# Patient Record
Sex: Female | Born: 1990 | Race: White | Hispanic: No | Marital: Single | Smoking: Never smoker
Health system: Southern US, Community
[De-identification: ages and names within clinical notes are randomized; demographics above are authoritative.]

---
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
## 2010-11-19 HISTORY — PX: TONSILLECTOMY: SUR1361

## 2018-10-14 ENCOUNTER — Emergency Department (HOSPITAL_COMMUNITY): Payer: BLUE CROSS/BLUE SHIELD

## 2018-10-14 ENCOUNTER — Emergency Department (HOSPITAL_COMMUNITY)
Admission: EM | Admit: 2018-10-14 | Discharge: 2018-10-15 | Disposition: A | Discharge status: Home / Self Care | Payer: BLUE CROSS/BLUE SHIELD | Attending: Emergency Medicine | Admitting: Emergency Medicine

## 2018-10-14 ENCOUNTER — Encounter (HOSPITAL_COMMUNITY): Payer: Self-pay | Admitting: Emergency Medicine

## 2018-10-14 DIAGNOSIS — H4710 Unspecified papilledema: Secondary | ICD-10-CM | POA: Insufficient documentation

## 2018-10-14 DIAGNOSIS — H539 Unspecified visual disturbance: Secondary | ICD-10-CM | POA: Diagnosis present

## 2018-10-14 NOTE — ED Provider Notes (Signed)
[facility] EMERGENCY DEPARTMENT Provider Note   CSN: [id number] Arrival date & time: 10/14/18  1841     History   Chief Complaint Chief Complaint  Patient presents with  . visual issues    HPI Katherine Stone is a 27 y.o. female presents the emergency department sent in by her optometrist for evaluation of vision loss.  The patient has had intermittent episodes of bilateral vision loss since May.  She states that she thought this was occurring because she has been spending a lot of time in front of a computer working on her masters degree.  She denies headaches, unilateral vision loss, diplopia, visual field deficits, difficulty with speech or swallowing,, nausea or vomiting.  Patient saw her PCP who referred her to an eye doctor.  He found papilledema.  He sent her with pictures.  Patient was sent for MRI evaluation.  HPI  History reviewed. No pertinent past medical history.  There are no active problems to display for this patient.    The histories are not reviewed yet. Please review them in the "History" navigator section and refresh this SmartLink.   OB History   None      Home Medications    Prior to Admission medications   Not on File    Family History No family history on file.  Social History Social History   Tobacco Use  . Smoking status: Never Smoker  . Smokeless tobacco: Never Used  Substance Use Topics  . Alcohol use: Yes    Comment: socially  . Drug use: Never     Allergies   Patient has no allergy information on record.   Review of Systems Review of Systems Ten systems reviewed and are negative for acute change, except as noted in the HPI.    Physical Exam Updated Vital Signs BP 122/82   Pulse 81   Temp 99.4 F (37.4 C) (Oral)   Resp 16   Ht 5\' 7"  (1.702 m)   Wt 64.9 kg   SpO2 100%   BMI 22.40 kg/m   Physical Exam  Constitutional: She is oriented to person, place, and time. She appears well-developed and  well-nourished. No distress.  HENT:  Head: Normocephalic and atraumatic.  Eyes: Pupils are equal, round, and reactive to light. Conjunctivae are normal. No scleral icterus.  Patient had a dilated eye exam earlier.  She has mydriatic pupils.  Neck: Normal range of motion.  Cardiovascular: Normal rate, regular rhythm and normal heart sounds. Exam reveals no gallop and no friction rub.  No murmur heard. Pulmonary/Chest: Effort normal and breath sounds normal. No respiratory distress.  Abdominal: Soft. Bowel sounds are normal. She exhibits no distension and no mass. There is no tenderness. There is no guarding.  Neurological: She is alert and oriented to person, place, and time.  Skin: Skin is warm and dry. She is not diaphoretic.  Psychiatric: Her behavior is normal.  Nursing note and vitals reviewed.    ED Treatments / Results  Labs (all labs ordered are listed, but only abnormal results are displayed) Labs Reviewed - No data to display  EKG None  Radiology No results found.  Procedures Procedures (including critical care time)  Medications Ordered in ED Medications - No data to display   Initial Impression / Assessment and Plan / ED Course  I have reviewed the triage vital signs and the nursing notes.  Pertinent labs & imaging results that were available during my care of the patient were reviewed  by me and considered in my medical decision making (see chart for details).     Since sent in for evaluation of papilledema and intermittent total vision loss.  I discussed the case with Dr. Laurence SlateAroor.  She will need MRI with and without contrast.  If negative does recommend a lumbar puncture.  Doubt venous sinus thrombosis as the patient does not have any headaches.  I have therefore opted not to perform MRV at this time.  11:45 PM I have given sign out to [facility] who will assume care of the patient .  Final Clinical Impressions(s) / ED Diagnoses   Final diagnoses:  None     ED Discharge Orders    None       Arthor CaptainHarris, Markita Stcharles, PA-C 10/14/18 2346    Cathren LaineSteinl, Kevin, MD 10/15/18 1535

## 2018-10-14 NOTE — ED Triage Notes (Signed)
Reports being seen at eye doctor today and was told she had papil edema and should have an MRI.  Symptoms blurred vision and intermittent total loss of vision.

## 2018-10-14 NOTE — ED Notes (Signed)
Intermittent episodes of blurry vision and loss of vision in bilateral eyes, lasting at most about 7 seconds since May. No associated headache or weakness. Seen by her provider and had Holter monitor placed because she reports her heart rate was dropping. Sent to opthalmology who told her she had swelling in the optic nerves, sent here for further eval. Alert, oriented. No symptoms at time of assessment in room.

## 2018-10-15 MED ORDER — GADOBUTROL 1 MMOL/ML IV SOLN
7.5000 mL | Freq: Once | INTRAVENOUS | Status: AC | PRN
  Administered 2018-10-15: 6.5 mL via INTRAVENOUS

## 2018-10-15 NOTE — ED Provider Notes (Signed)
2:30 AM Patient care assumed from [facility] PA-C at change of shift pending MRI.  The patient is presenting to the ED today for further evaluation of papilledema noted on outpatient optometry exam.  Case was previously discussed with neurology who recommended MRI.  I have followed up on the patient's imaging which confirms bilateral papilledema which is nonspecific.  Otherwise, no other changes noted on MRI brain.  I have discussed these findings with Dr. Laurence SlateAroor.  He recommends further work-up with lumbar puncture, though I believe this can be completed as an outpatient.  Range of motion symptoms have been ongoing for the past 7 months.  She has no complaints of headache.  Denies any numbness or tingling in her extremities.  Her vision at this time is normal.  She has had some gradual worsening in the frequency of her vision changes, but states this worsening has been over the past few months.  There has not been any specific acute change recently.  Will provide referrals to outpatient neurology.  Have discussed with the patient her need to have a lumbar puncture completed in a timely fashion.  The patient has been encouraged to return for any acute symptomatic worsening.  Return precautions discussed and provided. Patient discharged in stable condition with no unaddressed concerns.   Antony MaduraHumes, Glena Pharris, PA-C 10/15/18 0406    Derwood KaplanNanavati, Ankit, MD 10/15/18 [phone]

## 2018-10-15 NOTE — ED Provider Notes (Signed)
Reviewed chart from patient visit yesterday to check results of MRI/further ED workup.  MR was c/w papilledema. It appears that no LP was done it ED, and that decision made then to refer to outpt follow up w neurology.  I called patient to check on her, and advised her on need for LP to check ICP (and if elev, drain csf to decrease pressure, start diamox, etc.) - indicating that patient can return here, or go to closer ED to get LP done. Discussed reasons for LP including definitive dx, and if elevated, immediate tx of elevated icp. Also discussed risks of not having LP including permanent visual loss.   Pt does [facility], and that she will decide on returning to ED for LP today.          Cathren LaineSteinl, Carvin Almas, MD 10/15/18 1729

## 2018-10-15 NOTE — Discharge Instructions (Signed)
Follow up with a neurologist as soon as you are able. Call in the morning to schedule an appointment. You would benefit from having a lumbar puncture completed; this can be coordinated through a neurologist.  Return to the emergency department for new or concerning symptoms or if symptoms continue to worsen prior to outpatient follow up.

## 2018-10-20 ENCOUNTER — Ambulatory Visit: Payer: BLUE CROSS/BLUE SHIELD | Admitting: Diagnostic Neuroimaging

## 2018-10-20 ENCOUNTER — Encounter: Payer: Self-pay | Admitting: Diagnostic Neuroimaging

## 2018-10-20 VITALS — BP 119/75 | HR 67 | Ht 67.0 in | Wt 134.0 lb

## 2018-10-20 DIAGNOSIS — G932 Benign intracranial hypertension: Secondary | ICD-10-CM | POA: Diagnosis not present

## 2018-10-20 MED ORDER — ACETAZOLAMIDE 250 MG PO TABS: 250.0000 mg | ORAL_TABLET | Freq: Two times a day (BID) | ORAL | 6 refills | Status: DC

## 2018-10-20 NOTE — Progress Notes (Signed)
[facility]  PATIENT: Katherine Stone DOB: Oct 15, 1991  REFERRING CLINICIAN: ER  HISTORY FROM: patient and parents  REASON FOR VISIT: new consult    HISTORICAL  CHIEF COMPLAINT:  Chief Complaint  Patient presents with  . 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  May 2018 patient developed intermittent partial and complete vision loss, lasting few seconds at a time.  This continued to progress until November 2019. She saw eye doctor who diagnosed severe bilateral papilledema.  She was referred to the emergency room for further evaluation.  MRI scan of the brain showed no acute findings but demonstrated enlarged optic nerve sheaths.  Possibility of pseudotumor cerebri was raised.  She was recommended for outpatient follow-up.  Patient has noticed multiple episodes per day of transient visual obscuration.  This can happen sometimes when she bends over and stands up quickly.  Sometimes it happens randomly.  She has noticed intermittent dull headaches 1-2 times per week.  No tenderness.  No slurred speech or trouble to walk.  She has noticed some intermittent numbness in her left hand.  No change in nutrition, exercise, weight, medications.  Patient is an avid runner, since high school, and has competed in half marathons up until 1 year ago.  She continues to run on a treadmill multiple times per week.  Sometimes when she runs she feels some mild vision changes.    REVIEW OF SYSTEMS: Full 14 system review of systems performed and negative with exception of: Headache blurred vision loss of vision eye pain.   ALLERGIES: No Known Allergies  HOME MEDICATIONS: Outpatient Medications Prior to Visit  Medication Sig Dispense Refill  . Cholecalciferol (VITAMIN D3 PO) Take 2,000 Units  by mouth daily.     No facility-administered medications prior to visit.     PAST MEDICAL HISTORY: No past medical history on file.  PAST SURGICAL HISTORY: Past Surgical History:  Procedure Laterality Date  . TONSILLECTOMY  2012  . WISDOM TOOTH EXTRACTION  2009    FAMILY HISTORY: Family History  Problem Relation Age of Onset  . Crohn's disease Mother   . Diabetes Father   . Cancer Maternal Grandmother        lung  . Cancer Maternal Grandfather        prostate, lung  . Stroke Paternal Grandmother   . Dementia Paternal Grandfather        Alzheimer's    SOCIAL HISTORY: Social History   Socioeconomic History  . Marital status: Single    Spouse name: Not on file  . Number of children: 0  . Years of education: BA  . Highest education level: Bachelor's degree (e.g., BA, AB, BS)  Occupational History    Comment: [employer]    Comment: working on [employer] degree  Social Needs  . Financial resource strain: Not on file  . Food insecurity:    Worry: Not on file    Inability: Not on file  . Transportation needs:    Medical: Not on file    Non-medical: Not on file  Tobacco Use  . Smoking status: Never Smoker  . Smokeless tobacco: Never Used  Substance and Sexual Activity  . Alcohol use: Yes    Comment: socially  . Drug use: Never  . Sexual activity: Not on file  Lifestyle  . Physical activity:    Days per week: Not on file    Minutes per session: Not on file  . Stress: Not on file  Relationships  . Social connections:    Talks on phone: Not on file    Gets together: Not on file    Attends religious service: Not on file    Active member of club or organization: Not on file    Attends meetings of clubs or organizations: Not on file    Relationship status: Not on file  . Intimate partner violence:    Fear of current or ex partner: Not on file    Emotionally abused: Not on file    Physically abused: Not on file    Forced sexual activity: Not on file    Other Topics Concern  . Not on file  Social History Narrative   Lives alone, engaged   Caffeine- coffee, 2 cups daily     PHYSICAL EXAM  GENERAL EXAM/CONSTITUTIONAL: Vitals:  Vitals:   10/20/18 1611  BP: 119/75  Pulse: 67  Weight: 134 lb (60.8 kg)  Height: 5\' 7"  (1.702 m)     Body mass index is 20.99 kg/m. Wt Readings from Last 3 Encounters:  10/20/18 134 lb (60.8 kg)  10/14/18 143 lb (64.9 kg)     Patient is in no distress; well developed, nourished and groomed; neck is supple  CARDIOVASCULAR:  Examination of carotid arteries is normal; no carotid bruits  Regular rate and rhythm, no murmurs  Examination of peripheral vascular system by observation and palpation is normal  EYES:  Ophthalmoscopic exam of optic discs and posterior segments is --> NOTABLE FOR BILATERAL PAPILLEDEMA    Visual Acuity Screening   Right eye Left eye Both eyes  Without correction: 20/30 20/30   With correction:        MUSCULOSKELETAL:  Gait, strength, tone, movements noted in Neurologic exam below  NEUROLOGIC: MENTAL STATUS:  No flowsheet data found.  awake, alert, oriented to person, place and time  recent and remote memory intact  normal attention and concentration  language fluent, comprehension intact, naming intact  fund of knowledge appropriate  CRANIAL NERVE:   2nd - NOTABLE FOR BILATERAL PAPILLEDEMA  2nd, 3rd, 4th, 6th - pupils equal and reactive to light, visual fields full to confrontation, extraocular muscles intact, no nystagmus  5th - facial sensation symmetric  7th - facial strength symmetric  8th - hearing intact  9th - palate elevates symmetrically, uvula midline  11th - shoulder shrug symmetric  12th - tongue protrusion midline  MOTOR:   normal bulk and tone, full strength in the BUE, BLE  SENSORY:   normal and symmetric to light touch, temperature, vibration  COORDINATION:   finger-nose-finger, fine finger movements  normal  REFLEXES:   deep tendon reflexes present and symmetric  GAIT/STATION:   narrow based gait; romberg is negative     DIAGNOSTIC DATA (LABS, IMAGING, TESTING) - I reviewed patient records, labs, notes, testing and imaging myself where available.  No results found for: WBC, HGB, HCT, MCV, PLT No results found for: NA, K, CL, CO2, GLUCOSE, BUN, CREATININE, CALCIUM, PROT, ALBUMIN, AST, ALT, ALKPHOS, BILITOT, GFRNONAA, GFRAA No results found for: CHOL, HDL, LDLCALC, LDLDIRECT, TRIG, CHOLHDL No results found for: ZOXW9UHGBA1C No results found for: VITAMINB12 No results found for: TSH   10/14/18 MRI brain [I reviewed images myself and agree with interpretation. -VRP]  1. Normal brain. 2. Bilateral papilledema. This is nonspecific but may  be seen in intracranial hypertension. No empty sella turcica, venous sinus convexity or other additional secondary finding of intracranial hypertension. 3. Right mastoid effusion.    ASSESSMENT AND PLAN  27 y.o. year old female here with new onset papilledema, transient visual obscuration, headaches, consistent with idiopathic intracranial hypertension (pseudotumor cerebri).  No clear risk factors identified.  Could represent venous sinus stenosis, although no evidence on MRI imaging.  Dx:  1. IIH (idiopathic intracranial hypertension)     PLAN:  - lumbar puncture to measure opening pressure  - start acetazolamide 250mg  at bedtime x 2-3 days; then 250mg  twice a day x 2-3 days; then increase up 500mg  twice a day as tolerated  - follow up with eye doctor (Dr. Ala Dach)  Orders Placed This Encounter  Procedures  . DG FLUORO GUIDED LOC OF NEEDLE/CATH TIP FOR SPINAL INJECT LT   Meds ordered this encounter  Medications  . acetaZOLAMIDE (DIAMOX) 250 MG tablet    Sig: Take 1-2 tablets (250-500 mg total) by mouth 2 (two) times daily.    Dispense:  120 tablet    Refill:  6   Return in about 2 months (around 12/21/2018).  I reviewed images, labs,  notes, records myself. I summarized findings and reviewed with patient, for this high risk condition (idiopathic intracranial hypertension (pseudotumor cerebri)) requiring high complexity decision making.     Suanne Marker, MD 10/20/2018, 4:59 PM Certified in Neurology, Neurophysiology and Neuroimaging  [facility] [address] [place], [place] [address] [phone]

## 2018-10-20 NOTE — Patient Instructions (Signed)
  idiopathic intracranial hypertension (pseudotumor cerebri)   - lumbar puncture to measure opening pressure  - start acetazolamide 250mg  at bedtime x 2-3 days; then 250mg  twice a day x 2-3 days; then increase up 500mg  twice a day as tolerated

## 2018-10-21 NOTE — Progress Notes (Signed)
Office note fax[facility] Dr Michael Ford, [facility] [place], [place].

## 2018-10-23 ENCOUNTER — Encounter: Payer: Self-pay | Admitting: Radiology

## 2018-10-23 ENCOUNTER — Telehealth: Payer: Self-pay | Admitting: *Deleted

## 2018-10-23 ENCOUNTER — Ambulatory Visit
Admission: RE | Admit: 2018-10-23 | Discharge: 2018-10-23 | Disposition: A | Discharge status: Home / Self Care | Payer: BLUE CROSS/BLUE SHIELD | Source: Ambulatory Visit | Attending: Diagnostic Neuroimaging | Admitting: Diagnostic Neuroimaging

## 2018-10-23 VITALS — BP 117/63 | HR 57

## 2018-10-23 DIAGNOSIS — G932 Benign intracranial hypertension: Secondary | ICD-10-CM

## 2018-10-23 NOTE — Discharge Instructions (Signed)

## 2018-10-23 NOTE — Telephone Encounter (Signed)
Received message to call [facility], re: LP results. Spoke with Elease HashimotoPatricia who stated she doesn't see anything out of normal range. This RN requested she fax results to RN's pod, and gave her number. Elease Hashimotoatricia verbalized understanding.

## 2018-10-25 ENCOUNTER — Other Ambulatory Visit: Payer: Self-pay | Admitting: Neurology

## 2018-10-25 ENCOUNTER — Telehealth: Payer: Self-pay | Admitting: Neurology

## 2018-10-25 DIAGNOSIS — G971 Other reaction to spinal and lumbar puncture: Secondary | ICD-10-CM

## 2018-10-25 NOTE — Telephone Encounter (Signed)
I returned a call from patient's father who called on antibiotics saying that she had a spinal tap on 10/23/89 and increased with imaging upon request from Dr. Steele BergPenn emboli for pseudotumor cerebri. The CSF opening pressure was documented as 31 and closing pressure was 9. The patient is complaining of severe headache when she sits up or stands and has spent the last 2 days basically lying in bed. I informed him that the headaches seems like spinal headache. I advised the patient to lie in bed as much as she could and to drink plenty of fluids and to drink and he of caffeinated drinks. She was also advised to [facility] to see if they can set her up for spinal blood patch. He voiced understanding.

## 2018-10-27 ENCOUNTER — Telehealth: Payer: Self-pay | Admitting: *Deleted

## 2018-10-27 ENCOUNTER — Ambulatory Visit
Admission: RE | Admit: 2018-10-27 | Discharge: 2018-10-27 | Disposition: A | Discharge status: Home / Self Care | Payer: BLUE CROSS/BLUE SHIELD | Source: Ambulatory Visit | Attending: Neurology | Admitting: Neurology

## 2018-10-27 ENCOUNTER — Other Ambulatory Visit: Payer: Self-pay | Admitting: Neurology

## 2018-10-27 DIAGNOSIS — G971 Other reaction to spinal and lumbar puncture: Secondary | ICD-10-CM

## 2018-10-27 LAB — CSF CULTURE
RESULT: NO GROWTH
SPECIMEN QUALITY: ADEQUATE

## 2018-10-27 LAB — CSF CULTURE W GRAM STAIN: MICRO NUMBER:: 91457614

## 2018-10-27 LAB — CSF CELL COUNT WITH DIFFERENTIAL
RBC COUNT CSF: 0 {cells}/uL (ref 0–10)
WBC CSF: 0 {cells}/uL (ref 0–5)

## 2018-10-27 LAB — PROTEIN, CSF: TOTAL PROTEIN, CSF: 21 mg/dL (ref 15–45)

## 2018-10-27 LAB — GLUCOSE, CSF: Glucose, CSF: 64 mg/dL (ref 40–80)

## 2018-10-27 MED ORDER — IOPAMIDOL (ISOVUE-M 200) INJECTION 41%
1.0000 mL | Freq: Once | INTRAMUSCULAR | Status: AC
  Administered 2018-10-27: 1 mL via EPIDURAL

## 2018-10-27 NOTE — Telephone Encounter (Signed)
Received fax of LP results from [facility]. On Dr Visteon CorporationPenumalli's desk for review.

## 2018-10-27 NOTE — Discharge Instructions (Signed)

## 2018-10-27 NOTE — Progress Notes (Signed)
20cc of blood drawn from left AC space for epidural blood patch; site unremarkable. 

## 2018-10-29 ENCOUNTER — Telehealth: Payer: Self-pay

## 2018-10-29 NOTE — Telephone Encounter (Signed)
Received a call from patient's dad (on DPR) asking if the epidural blood patch (10/27/18) or the LP (10/23/18) could be causing patient "discomfort in both of her arms."  I told him neither procedure would cause these symptoms, though all of the bedrest his daughter has been on (for six days) might cause some issues in her neck.  They have a call in to Dr. Marjory LiesPenumalli regarding this.  I gave him our direct number should he have any more questions.  Donell SievertJeanne Juvencio Verdi, RN

## 2018-10-29 NOTE — Telephone Encounter (Signed)
Spoke with patient and informed her Dr Marjory LiesPenumalli stated her arm pain is not related to her LP. Advised if she does not get better or develops other symptoms to call her PCP. Advised if her headache gets worse to call this office. She verbalized understanding, appreciation.

## 2018-10-29 NOTE — Telephone Encounter (Signed)
Spoke with patient and informed her that her CSF labs are normal. This RN inquired how she is feeling after receiving blood patch. She stated her headache is not completely gone but much better. She stated yesterday her arms began to ache. She is taking Tylenol and using ice for relief. She asked if this is a normal side effect from an LP. This RN advised her it it not an expected or typical side effect form an LP, but will discuss with Dr Marjory LiesPenumalli and let her know his reply. This RN advised she drink 8 glasses of water a day and some caffeine. She stated she had been drinking a lot of caffeine but has cut back. She stated she was "not drinking enough water".  She verbalized understanding, appreciation of call.

## 2018-12-22 ENCOUNTER — Ambulatory Visit: Payer: BLUE CROSS/BLUE SHIELD | Admitting: Diagnostic Neuroimaging

## 2018-12-22 ENCOUNTER — Encounter: Payer: Self-pay | Admitting: Diagnostic Neuroimaging

## 2018-12-22 VITALS — BP 109/70 | HR 63 | Ht 67.0 in | Wt 134.6 lb

## 2018-12-22 DIAGNOSIS — G932 Benign intracranial hypertension: Secondary | ICD-10-CM | POA: Diagnosis not present

## 2018-12-22 MED ORDER — ACETAZOLAMIDE 250 MG PO TABS: 500.0000 mg | ORAL_TABLET | Freq: Two times a day (BID) | ORAL | 12 refills | Status: DC

## 2018-12-22 NOTE — Progress Notes (Signed)
[facility]  PATIENT: Katherine MattesMary Stone DOB: 05/30/1991  REFERRING CLINICIAN: ER  HISTORY FROM: patient and parents  REASON FOR VISIT: follow up    HISTORICAL  CHIEF COMPLAINT:  Chief Complaint  Patient presents with  . IIH    rm 5, Mom- Michaela, dad- Ron, " monitor progress after spinal tap, optometrist visit, check on labs requested by optometrist for lyme disease; still having periods of vision loss every few days, more often in AM; have had headache off and on - pressure headache, I take Tylenol as needed which helps"  . Follow-up    2 month    HISTORY OF PRESENT ILLNESS:   UPDATE (12/22/18, VRP): Since last visit, doing WELL. Symptoms are slightly improved. Still with some HA and vision loss transiently. Severity is mild-moderate. No alleviating or aggravating factors. Tolerating acetazolamide 500mg  twice a day.    PRIOR HPI (10/20/18): 28 year old female here for evaluation of papilledema.  May 2018 patient developed intermittent partial and complete vision loss, lasting few seconds at a time.  This continued to progress until November 2019. She saw eye doctor who diagnosed severe bilateral papilledema.  She was referred to the emergency room for further evaluation.  MRI scan of the brain showed no acute findings but demonstrated enlarged optic nerve sheaths.  Possibility of pseudotumor cerebri was raised.  She was recommended for outpatient follow-up.  Patient has noticed multiple episodes per day of transient visual obscuration.  This can happen sometimes when she bends over and stands up quickly.  Sometimes it happens randomly.  She has noticed intermittent dull headaches 1-2 times per week.  No tenderness.  No slurred speech or trouble to walk.  She has noticed some intermittent numbness in her left hand.  No change in nutrition, exercise, weight, medications.  Patient is an avid runner, since high school, and has competed in half marathons up until 1 year  ago.  She continues to run on a treadmill multiple times per week.  Sometimes when she runs she feels some mild vision changes.    REVIEW OF SYSTEMS: Full 14 system review of systems performed and negative with exception of: headache ringing in ears loss of vision.    ALLERGIES: No Known Allergies  HOME MEDICATIONS: Outpatient Medications Prior to Visit  Medication Sig Dispense Refill  . acetaZOLAMIDE (DIAMOX) 250 MG tablet Take 1-2 tablets (250-500 mg total) by mouth 2 (two) times daily. 120 tablet 6  . Cholecalciferol (VITAMIN D3 PO) Take 2,000 Units by mouth daily.     No facility-administered medications prior to visit.     PAST MEDICAL HISTORY: No past medical history on file.  PAST SURGICAL HISTORY: Past Surgical History:  Procedure Laterality Date  . TONSILLECTOMY  2012  . WISDOM TOOTH EXTRACTION  2009    FAMILY HISTORY: Family History  Problem Relation Age of Onset  . Crohn's disease Mother   . Diabetes Father   . Cancer Maternal Grandmother        lung  . Cancer Maternal Grandfather        prostate, lung  . Stroke Paternal Grandmother   . Dementia Paternal Grandfather        Alzheimer's    SOCIAL HISTORY: Social History   Socioeconomic History  . Marital status: Single    Spouse name: Not on file  . Number of children: 0  . Years of education: BA  . Highest education level: Bachelor's degree (e.g., BA, AB, BS)  Occupational History  Comment: [employer]: working on [employer] degree  Social Needs  . Financial resource strain: Not on file  . Food insecurity:    Worry: Not on file    Inability: Not on file  . Transportation needs:    Medical: Not on file    Non-medical: Not on file  Tobacco Use  . Smoking status: Never Smoker  . Smokeless tobacco: Never Used  Substance and Sexual Activity  . Alcohol use: Yes    Comment: socially  . Drug use: Never  . Sexual activity: Not on file  Lifestyle  . Physical activity:    Days  per week: Not on file    Minutes per session: Not on file  . Stress: Not on file  Relationships  . Social connections:    Talks on phone: Not on file    Gets together: Not on file    Attends religious service: Not on file    Active member of club or organization: Not on file    Attends meetings of clubs or organizations: Not on file    Relationship status: Not on file  . Intimate partner violence:    Fear of current or ex partner: Not on file    Emotionally abused: Not on file    Physically abused: Not on file    Forced sexual activity: Not on file  Other Topics Concern  . Not on file  Social History Narrative   Lives alone, engaged   Caffeine- coffee, 2 cups daily     PHYSICAL EXAM  GENERAL EXAM/CONSTITUTIONAL: Vitals:  Vitals:   12/22/18 1012  BP: 109/70  Pulse: 63  Weight: 134 lb 9.6 oz (61.1 kg)  Height: 5\' 7"  (1.702 m)   Body mass index is 21.08 kg/m. Wt Readings from Last 3 Encounters:  12/22/18 134 lb 9.6 oz (61.1 kg)  10/20/18 134 lb (60.8 kg)  10/14/18 143 lb (64.9 kg)    Patient is in no distress; well developed, nourished and groomed; neck is supple  CARDIOVASCULAR:  Examination of carotid arteries is normal; no carotid bruits  Regular rate and rhythm, no murmurs  Examination of peripheral vascular system by observation and palpation is normal  EYES:  Ophthalmoscopic exam of optic discs and posterior segments is --> NOTABLE FOR BILATERAL PAPILLEDEMA    Visual Acuity Screening   Right eye Left eye Both eyes  Without correction: 20/30 20/30   With correction:       MUSCULOSKELETAL:  Gait, strength, tone, movements noted in Neurologic exam below  NEUROLOGIC: MENTAL STATUS:  No flowsheet data found.  awake, alert, oriented to person, place and time  recent and remote memory intact  normal attention and concentration  language fluent, comprehension intact, naming intact  fund of knowledge appropriate  CRANIAL NERVE:   2nd -  NOTABLE FOR BILATERAL PAPILLEDEMA  2nd, 3rd, 4th, 6th - pupils equal and reactive to light, visual fields full to confrontation, extraocular muscles intact, no nystagmus  5th - facial sensation symmetric  7th - facial strength symmetric  8th - hearing intact  9th - palate elevates symmetrically, uvula midline  11th - shoulder shrug symmetric  12th - tongue protrusion midline  MOTOR:   normal bulk and tone, full strength in the BUE, BLE  SENSORY:   normal and symmetric to light touch, temperature, vibration  COORDINATION:   finger-nose-finger, fine finger movements normal  REFLEXES:   deep tendon reflexes present and symmetric  GAIT/STATION:   narrow based gait;  romberg is negative     DIAGNOSTIC DATA (LABS, IMAGING, TESTING) - I reviewed patient records, labs, notes, testing and imaging myself where available.  No results found for: WBC, HGB, HCT, MCV, PLT No results found for: NA, K, CL, CO2, GLUCOSE, BUN, CREATININE, CALCIUM, PROT, ALBUMIN, AST, ALT, ALKPHOS, BILITOT, GFRNONAA, GFRAA No results found for: CHOL, HDL, LDLCALC, LDLDIRECT, TRIG, CHOLHDL No results found for: XKGY1E No results found for: VITAMINB12 No results found for: TSH   10/14/18 MRI brain [I reviewed images myself and agree with interpretation. -VRP]  1. Normal brain. 2. Bilateral papilledema. This is nonspecific but may be seen in intracranial hypertension. No empty sella turcica, venous sinus convexity or other additional secondary finding of intracranial hypertension. 3. Right mastoid effusion.  10/23/18 LP  1. Technically successful fluoroscopically guided lumbar puncture. 2. Elevated opening CSF pressure of 31 cm water.  CSF --> WBC 0, RBC 0, PROT 21, GLUCOSE 64    ASSESSMENT AND PLAN  28 y.o. year old female here with new onset papilledema, transient visual obscuration, headaches, consistent with idiopathic intracranial hypertension (pseudotumor cerebri).  No clear risk factors  for IIH identified.  Could represent venous sinus stenosis, although no evidence on MRI imaging.  Dx:  1. IIH (idiopathic intracranial hypertension)      PLAN:  - increase acetazolamide to 750mg  twice a day (for 1-2 weeks); then increase to 1000mg  twice a day  - follow up with optometrist doctor (Dr. Ala Dach)  - will refer to neuro-ophthalmology (second opinion)  Orders Placed This Encounter  Procedures  . Ambulatory referral to Ophthalmology   Meds ordered this encounter  Medications  . acetaZOLAMIDE (DIAMOX) 250 MG tablet    Sig: Take 2-4 tablets (500-1,000 mg total) by mouth 2 (two) times daily.    Dispense:  240 tablet    Refill:  12   Return in about 3 months (around 03/22/2019).    Suanne Marker, MD 12/22/2018, 10:50 AM Certified in Neurology, Neurophysiology and Neuroimaging  [facility] [address] [place], [place] [address] [phone]

## 2019-01-19 ENCOUNTER — Ambulatory Visit: Payer: BLUE CROSS/BLUE SHIELD | Admitting: Adult Health

## 2019-03-11 ENCOUNTER — Telehealth: Payer: Self-pay | Admitting: *Deleted

## 2019-03-11 NOTE — Telephone Encounter (Signed)
Called patient and advised her due to current COVID 19 pandemic, our office is severely reducing in person visits in order to minimize the risk to our patients and healthcare providers. We recommend to convert your appointment to a video visit. We'll take all precautions to reduce any security or privacy concerns. This will be treated like an office visit, and we will file with your insurance. She consented to video visit and to reschedule for sooner. Pt's email is [email or web address]. Pt understands that the [employer] webex software must be downloaded and operational on the device pt plans to use for the visit. Rescheduled FU and updated EMR. She verbalized understanding, appreciation. Webex scheduled; e mail sent.

## 2019-03-17 NOTE — Telephone Encounter (Signed)
Called patient and advised her Dr Marjory Lies is using an easier web site for video visits, [email or web address]. She agreed and  verbalized understanding, appreciation. New e mail sent with instructions.

## 2019-03-18 ENCOUNTER — Ambulatory Visit (INDEPENDENT_AMBULATORY_CARE_PROVIDER_SITE_OTHER): Payer: BLUE CROSS/BLUE SHIELD | Admitting: Diagnostic Neuroimaging

## 2019-03-18 ENCOUNTER — Other Ambulatory Visit: Payer: Self-pay

## 2019-03-18 ENCOUNTER — Encounter: Payer: Self-pay | Admitting: Diagnostic Neuroimaging

## 2019-03-18 DIAGNOSIS — G43009 Migraine without aura, not intractable, without status migrainosus: Secondary | ICD-10-CM

## 2019-03-18 DIAGNOSIS — G932 Benign intracranial hypertension: Secondary | ICD-10-CM

## 2019-03-18 MED ORDER — ACETAZOLAMIDE 250 MG PO TABS: 750.0000 mg | ORAL_TABLET | Freq: Two times a day (BID) | ORAL | 12 refills | Status: AC

## 2019-03-18 NOTE — Progress Notes (Signed)
[facility]  PATIENT: Katherine MattesMary Campau DOB: April 20, 1991  REFERRING CLINICIAN: ER  HISTORY FROM: patient and parents  REASON FOR VISIT: follow up    HISTORICAL  CHIEF COMPLAINT:  Chief Complaint  Patient presents with  . Other    IIH    HISTORY OF PRESENT ILLNESS:   UPDATE (03/18/19, VRP): Since last visit, doing better with vision sxs.  No more transient visual obscuration.  Has increased her acetazolamide up to 1000 mg twice a day over the last 3 to 4 weeks.  She has noticed some more slurred speech, word finding difficulty since that time.  Also now having different type of headaches in the last few weeks consisting of back of the head throbbing sensation, nausea, sensitivity to light and sound.  He is having 2-3 times per week.  She has family history of migraine in an aunt.  Patient not been able to follow-up with optometry for neuro-ophthalmology yet.  She has appointment set up in June 2020.  UPDATE (12/22/18, VRP): Since last visit, doing WELL. Symptoms are slightly improved. Still with some HA and vision loss transiently. Severity is mild-moderate. No alleviating or aggravating factors. Tolerating acetazolamide 500mg  twice a day.    PRIOR HPI (10/20/18): 28 year old female here for evaluation of papilledema.  May 2018 patient developed intermittent partial and complete vision loss, lasting few seconds at a time.  This continued to progress until November 2019. She saw eye doctor who diagnosed severe bilateral papilledema.  She was referred to the emergency room for further evaluation.  MRI scan of the brain showed no acute findings but demonstrated enlarged optic nerve sheaths.  Possibility of pseudotumor cerebri was raised.  She was recommended for outpatient follow-up.  Patient has noticed multiple episodes per day of transient visual obscuration.  This can happen sometimes when she bends over and stands up quickly.  Sometimes it happens randomly.  She has noticed  intermittent dull headaches 1-2 times per week.  No tenderness.  No slurred speech or trouble to walk.  She has noticed some intermittent numbness in her left hand.  No change in nutrition, exercise, weight, medications.  Patient is an avid runner, since high school, and has competed in half marathons up until 1 year ago.  She continues to run on a treadmill multiple times per week.  Sometimes when she runs she feels some mild vision changes.    REVIEW OF SYSTEMS: Full 14 system review of systems performed and negative except: as per HPI.   ALLERGIES: No Known Allergies  HOME MEDICATIONS: Outpatient Medications Prior to Visit  Medication Sig Dispense Refill  . acetaZOLAMIDE (DIAMOX) 250 MG tablet Take 2-4 tablets (500-1,000 mg total) by mouth 2 (two) times daily. 240 tablet 12  . Cholecalciferol (VITAMIN D3 PO) Take 2,000 Units by mouth daily.     No facility-administered medications prior to visit.     PAST MEDICAL HISTORY: No past medical history on file.  PAST SURGICAL HISTORY: Past Surgical History:  Procedure Laterality Date  . TONSILLECTOMY  2012  . WISDOM TOOTH EXTRACTION  2009    FAMILY HISTORY: Family History  Problem Relation Age of Onset  . Crohn's disease Mother   . Diabetes Father   . Cancer Maternal Grandmother        lung  . Cancer Maternal Grandfather        prostate, lung  . Stroke Paternal Grandmother   . Dementia Paternal Grandfather        Alzheimer's  SOCIAL HISTORY: Social History   Socioeconomic History  . Marital status: Single    Spouse name: Not on file  . Number of children: 0  . Years of education: BA  . Highest education level: Bachelor's degree (e.g., BA, AB, BS)  Occupational History    Comment: [employer]    Comment: working on [employer] degree  Social Needs  . Financial resource strain: Not on file  . Food insecurity:    Worry: Not on file    Inability: Not on file  . Transportation needs:    Medical: Not on file     Non-medical: Not on file  Tobacco Use  . Smoking status: Never Smoker  . Smokeless tobacco: Never Used  Substance and Sexual Activity  . Alcohol use: Yes    Comment: socially  . Drug use: Never  . Sexual activity: Not on file  Lifestyle  . Physical activity:    Days per week: Not on file    Minutes per session: Not on file  . Stress: Not on file  Relationships  . Social connections:    Talks on phone: Not on file    Gets together: Not on file    Attends religious service: Not on file    Active member of club or organization: Not on file    Attends meetings of clubs or organizations: Not on file    Relationship status: Not on file  . Intimate partner violence:    Fear of current or ex partner: Not on file    Emotionally abused: Not on file    Physically abused: Not on file    Forced sexual activity: Not on file  Other Topics Concern  . Not on file  Social History Narrative   Lives alone, engaged   Caffeine- coffee, 2 cups daily     PHYSICAL EXAM   VIDEO EXAM  GENERAL EXAM/CONSTITUTIONAL:  Vitals: There were no vitals filed for this visit.  There is no height or weight on file to calculate BMI. Wt Readings from Last 3 Encounters:  12/22/18 134 lb 9.6 oz (61.1 kg)  10/20/18 134 lb (60.8 kg)  10/14/18 143 lb (64.9 kg)     Patient is in no distress; well developed, nourished and groomed; neck is supple   NEUROLOGIC: MENTAL STATUS:  No flowsheet data found.  awake, alert, oriented to person, place and time  recent and remote memory intact  normal attention and concentration  language fluent, comprehension intact, naming intact  fund of knowledge appropriate  CRANIAL NERVE:   2nd, 3rd, 4th, 6th - visual fields full to confrontation, extraocular muscles intact, no nystagmus  5th - facial sensation symmetric  7th - facial strength symmetric  8th - hearing intact  11th - shoulder shrug symmetric  12th - tongue protrusion midline  MOTOR:    SUBTLE POSTURAL TREMOR IN BUE; NO DRIFT IN BUE  COORDINATION:   fine finger movements normal     DIAGNOSTIC DATA (LABS, IMAGING, TESTING) - I reviewed patient records, labs, notes, testing and imaging myself where available.  No results found for: WBC, HGB, HCT, MCV, PLT No results found for: NA, K, CL, CO2, GLUCOSE, BUN, CREATININE, CALCIUM, PROT, ALBUMIN, AST, ALT, ALKPHOS, BILITOT, GFRNONAA, GFRAA No results found for: CHOL, HDL, LDLCALC, LDLDIRECT, TRIG, CHOLHDL No results found for: ZOXW9U No results found for: VITAMINB12 No results found for: TSH   10/14/18 MRI brain [I reviewed images myself and agree with interpretation. -VRP]  1. Normal brain. 2. Bilateral  papilledema. This is nonspecific but may be seen in intracranial hypertension. No empty sella turcica, venous sinus convexity or other additional secondary finding of intracranial hypertension. 3. Right mastoid effusion.  10/23/18 LP  1. Technically successful fluoroscopically guided lumbar puncture. 2. Elevated opening CSF pressure of 31 cm water.  CSF --> WBC 0, RBC 0, PROT 21, GLUCOSE 64    ASSESSMENT AND PLAN  28 y.o. year old female here with new onset papilledema, transient visual obscuration, headaches, consistent with idiopathic intracranial hypertension (pseudotumor cerebri).  No clear risk factors for IIH identified.  Could represent venous sinus stenosis, although no evidence on MRI imaging.  Dx:  1. IIH (idiopathic intracranial hypertension)   2. Migraine without aura and without status migrainosus, not intractable     Virtual Visit via Video Note  I connected with Katherine Stone on 03/18/19 at  9:30 AM EDT by a video enabled telemedicine application and verified that I am speaking with the correct person using two identifiers. Patient is at their home. I am at the office.    I discussed the limitations of evaluation and management by telemedicine and the availability of in person appointments. The  patient expressed understanding and agreed to proceed.  I discussed the assessment and treatment plan with the patient. The patient was provided an opportunity to ask questions and all were answered. The patient agreed with the plan and demonstrated an understanding of the instructions.   The patient was advised to call back or seek an in-person evaluation if the symptoms worsen or if the condition fails to improve as anticipated.  I provided 30 minutes of non-face-to-face time during this encounter.   PLAN:  IIH idiopathic intracranial hypertension (pseudotumor cerebri) - continue acetazolamide (REDUCE TO  twice a day for now) - follow up with optometrist doctor (Dr. Ala Dach) - follow up with neuro-ophthalmology (second opinion)  MIGRAINE WITHOUT AURA - consider CGRP antagonist + triptan in future  Meds ordered this encounter  Medications  . acetaZOLAMIDE (DIAMOX) 250 MG tablet    Sig: Take 3-4 tablets (750-1,000 mg total) by mouth 2 (two) times daily.    Dispense:  240 tablet    Refill:  12   Return in about 6 months (around 09/17/2019).    Suanne Marker, MD 03/18/2019, 9:28 AM Certified in Neurology, Neurophysiology and Neuroimaging  [facility] [address] [place], [place] [address] [phone]

## 2019-03-31 ENCOUNTER — Ambulatory Visit: Payer: BLUE CROSS/BLUE SHIELD | Admitting: Diagnostic Neuroimaging

## 2020-10-03 IMAGING — XA DG FLUORO GUIDE LUMBAR PUNCTURE
1 series · 1 of 1 positions shown · non-contrast
Comparison: none

CLINICAL DATA: Possible idiopathic intracranial hypertension.

[Series 1: ortho standard · 1 of 1 slices shown]
[im 1/1]
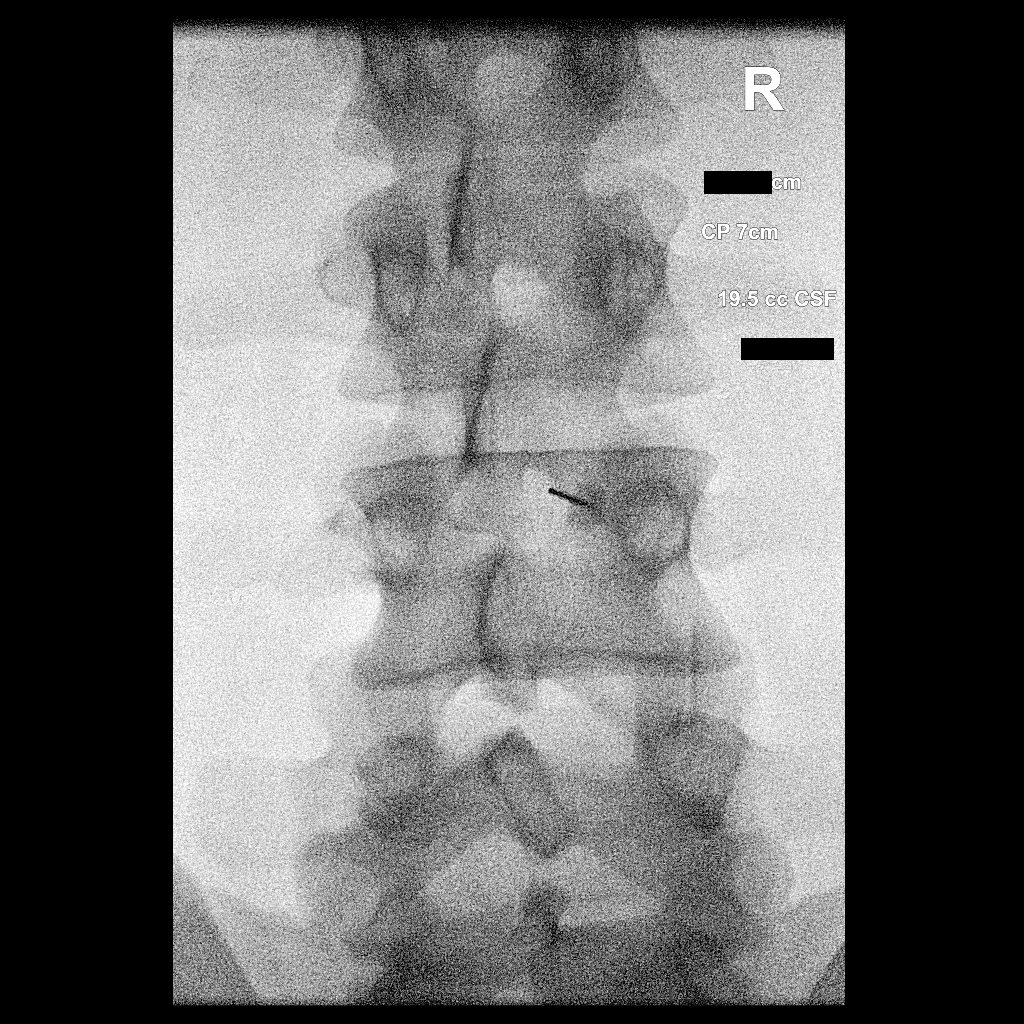

[1 of 1 positions shown; findings below may reference images not displayed]

EXAM:
DIAGNOSTIC LUMBAR PUNCTURE UNDER FLUOROSCOPIC GUIDANCE

FLUOROSCOPY TIME:  Fluoroscopy Time:  1 second

Radiation Exposure Index (if provided by the fluoroscopic device):
0.4 mGy

Number of Acquired Spot Images: 0

PROCEDURE:
Informed consent was obtained from the patient prior to the
procedure, including potential complications of headache, allergy,
and pain. With the patient prone, the lower back was prepped with
Betadine. 1% Lidocaine was used for local anesthesia. Lumbar
puncture was performed at the L3-L4 level using a 3.5 inch 20 gauge
needle with return of clear, colorless CSF with an opening pressure
of 31 cm water. 19.5 ml of CSF were obtained for laboratory studies.
Closing pressure was 7 cm water. The patient tolerated the procedure
well and there were no apparent complications.
IMPRESSION: 1. Technically successful fluoroscopically guided lumbar puncture.
2. Elevated opening CSF pressure of 31 cm water.
